# Patient Record
Sex: Female | Born: 2000 | Race: White | Hispanic: No | Marital: Single | Smoking: Never smoker
Health system: Southern US, Community
[De-identification: ages and names within clinical notes are randomized; demographics above are authoritative.]

## PROBLEM LIST (undated history)

## (undated) DIAGNOSIS — F319 Bipolar disorder, unspecified: Secondary | ICD-10-CM

---
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
## 2020-02-12 ENCOUNTER — Encounter: Payer: Self-pay | Admitting: Psychiatry

## 2023-11-30 ENCOUNTER — Ambulatory Visit
Admission: EM | Admit: 2023-11-30 | Discharge: 2023-11-30 | Disposition: A | Discharge status: Home / Self Care | Attending: Family Medicine | Admitting: Family Medicine

## 2023-11-30 DIAGNOSIS — J069 Acute upper respiratory infection, unspecified: Secondary | ICD-10-CM

## 2023-11-30 LAB — POC COVID19/FLU A&B COMBO
Covid Antigen, POC: NEGATIVE
Influenza A Antigen, POC: NEGATIVE
Influenza B Antigen, POC: NEGATIVE

## 2023-11-30 MED ORDER — CETIRIZINE HCL 10 MG PO TABS: 10.0000 mg | ORAL_TABLET | Freq: Every day | ORAL | 0 refills | Status: AC

## 2023-11-30 MED ORDER — PROMETHAZINE-DM 6.25-15 MG/5ML PO SYRP: 5.0000 mL | ORAL_SOLUTION | Freq: Three times a day (TID) | ORAL | 0 refills | Status: AC | PRN

## 2023-11-30 MED ORDER — IBUPROFEN 600 MG PO TABS: 600.0000 mg | ORAL_TABLET | Freq: Four times a day (QID) | ORAL | 0 refills | Status: AC | PRN

## 2023-11-30 MED ORDER — PSEUDOEPHEDRINE HCL 30 MG PO TABS: 30.0000 mg | ORAL_TABLET | Freq: Three times a day (TID) | ORAL | 0 refills | Status: AC | PRN

## 2023-11-30 NOTE — ED Provider Notes (Signed)
 [facility] - URGENT CARE C[facility]  Note:  This document was prepared using Conservation officer, historic buildings and may include unintentional dictation errors.  MRN: [id number] DOB: 2001-03-31  Subjective:   Melinda Lutz is a 23 y.o. female presenting for 1 day history of chills, sweats, then started to have fever, fatigue. Used ibuprofen  with some relief of her fevers, bad headache.  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  No known established diagnoses.  No chest pain, shortness of breath, wheezing, nausea, vomiting, abdominal pain, body pains, ear pain, rashes.  No history of asthma.  No smoking of any kind including cigarettes, cigars, vaping, marijuana use.    No current facility-administered medications for this encounter.  Current Outpatient Medications:    hydrOXYzine (ATARAX) 25 MG tablet, Take 25 mg by mouth 3 (three) times daily., Disp: , Rfl:    ibuprofen  (ADVIL ) 200 MG tablet, Take 200 mg by mouth every 6 (six) hours as needed., Disp: , Rfl:    Norgestim-Eth Estrad Triphasic (NORGESTIMATE-ETHINYL ESTRADIOL TRIPHASIC) 0.18/0.215/0.25 MG-25 MCG tab, Take 1 tablet by mouth daily., Disp: , Rfl:    ascorbic acid (VITAMIN C) 100 MG tablet, Take 100 mg by mouth., Disp: , Rfl:    No Known Allergies  Past Medical History:  Diagnosis Date   Bipolar 1 disorder (HCC)      Past Surgical History:  Procedure Laterality Date   BREAST CYST EXCISION      History reviewed. No pertinent family history.  Social History   Tobacco Use   Smoking status: Never   Smokeless tobacco: Never  Vaping Use   Vaping status: Never Used  Substance Use Topics   Alcohol use: No   Drug use: Never    ROS   Objective:   Vitals: BP 126/77 (BP Location: Left Arm)   Pulse 80   Temp (!) 97.4 F (36.3 C) (Oral) Comment: needs recheck- thermomether not working correctly  Resp 16   SpO2 96%   Physical Exam Constitutional:      General: She is not in  acute distress.    Appearance: Normal appearance. She is well-developed and normal weight. She is not ill-appearing, toxic-appearing or diaphoretic.  HENT:     Head: Normocephalic and atraumatic.     Right Ear: Tympanic membrane, ear canal and external ear normal. No drainage or tenderness. No middle ear effusion. There is no impacted cerumen. Tympanic membrane is not erythematous or bulging.     Left Ear: Tympanic membrane, ear canal and external ear normal. No drainage or tenderness.  No middle ear effusion. There is no impacted cerumen. Tympanic membrane is not erythematous or bulging.     Nose: Nose normal. No congestion or rhinorrhea.     Mouth/Throat:     Mouth: Mucous membranes are moist. No oral lesions.     Pharynx: No pharyngeal swelling, oropharyngeal exudate, posterior oropharyngeal erythema or uvula swelling.     Tonsils: No tonsillar exudate or tonsillar abscesses.  Eyes:     General: No scleral icterus.       Right eye: No discharge.        Left eye: No discharge.     Extraocular Movements: Extraocular movements intact.     Right eye: Normal extraocular motion.     Left eye: Normal extraocular motion.     Conjunctiva/sclera: Conjunctivae normal.  Cardiovascular:     Rate and Rhythm: Normal rate and regular rhythm.     Heart sounds:  Normal heart sounds. No murmur heard.    No friction rub. No gallop.  Pulmonary:     Effort: Pulmonary effort is normal. No respiratory distress.     Breath sounds: No stridor. No wheezing, rhonchi or rales.  Chest:     Chest wall: No tenderness.  Musculoskeletal:     Cervical back: Normal range of motion and neck supple.  Lymphadenopathy:     Cervical: No cervical adenopathy.  Skin:    General: Skin is warm and dry.  Neurological:     General: No focal deficit present.     Mental Status: She is alert and oriented to person, place, and time.  Psychiatric:        Mood and Affect: Mood normal.        Behavior: Behavior normal.      Results for orders placed or performed during the hospital encounter of 11/30/23 (from the past 24 hours)  POC Covid + Flu A/B Antigen     Status: None   Collection Time: 11/30/23  2:56 PM  Result Value Ref Range   Influenza A Antigen, POC Negative Negative   Influenza B Antigen, POC Negative Negative   Covid Antigen, POC Negative Negative    Assessment and Plan :   PDMP not reviewed this encounter.  1. Viral upper respiratory infection    Deferred imaging given clear cardiopulmonary exam, hemodynamically stable vital signs.  Suspect viral URI, viral syndrome. Physical exam findings reassuring and vital signs stable for discharge. Advised supportive care, offered symptomatic relief. Counseled patient on potential for adverse effects with medications prescribed/recommended today, ER and return-to-clinic precautions discussed, patient verbalized understanding.     Christopher Savannah, [place] 11/30/23 [phone]

## 2023-11-30 NOTE — ED Triage Notes (Signed)
 Pt reports chills since last night; fatigue, fever 100.3 F this morning. 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 Ibuprofen  gives some relief.

## 2023-11-30 NOTE — Discharge Instructions (Signed)
 We will manage this as a viral upper illness. For sore throat or cough try using a honey-based tea. Use 3 teaspoons of honey with juice squeezed from half lemon. Place shaved pieces of ginger into 1/2-1 cup of water and warm over stove top. Then mix the ingredients and repeat every 4 hours as needed. Please take ibuprofen  600mg  every 6 hours with food alternating with OR taken together with Tylenol  500mg -650mg  every 6 hours for throat pain, fevers, aches and pains. Hydrate very well with at least 2 liters of water. Eat light meals such as soups (chicken and noodles, vegetable, chicken and wild rice).  Do not eat foods that you are allergic to.  Taking an antihistamine like Zyrtec  (10mg  daily) can help against postnasal drainage, sinus congestion which can cause sinus pain, sinus headaches, throat pain, painful swallowing, coughing.  You can take this together with pseudoephedrine  (Sudafed) at a dose of 30mg  3 times a day or twice daily as needed for the same kind of nasal drip, congestion.  Use cough syrup as needed.

## 2024-02-20 ENCOUNTER — Ambulatory Visit (INDEPENDENT_AMBULATORY_CARE_PROVIDER_SITE_OTHER): Admitting: Psychiatry

## 2024-02-26 NOTE — Progress Notes (Signed)
 Assessment/Impression   1. Acute bacterial pharyngitis  amoxicillin (AMOXIL) 500 mg capsule      Patient has verbalized an understanding of the limitations for virtual video consults   Assessment & Plan Initial Assessment: Sore throat for three days. History of recurrent strep infections. Strep swab negative.  Differential Diagnosis: - Strep throat: History of recurrent strep infections. Negative strep swab. Empirical treatment with amoxicillin.  Urgent Care Course: - Prescription for amoxicillin sent to pharmacy.  Final Assessment: Empirical treatment with amoxicillin due to history of recurrent strep infections and current symptoms. Advised to replace toothbrush to prevent reinfection.  Clinical Impression: - Pharyngitis, bacterial   Disposition: - Follow-Up: Work note provided for return on Wednesday.  Patient Education: Replace toothbrush to prevent reinfection.  Shared medical decision making with patient: Patient presenting with sore throat. Patient meets [facility] Criteria for strep pharyngitis. Patient is nontoxic, well-appearing, and hydrated. Because the patient has no uvula deviation, trismus, dysphonia, or drooling there is no evidence of secondary complications such as peritonsillar abscess, retropharyngeal abscess, or epiglottitis. Patient is tolerating PO fluids without difficulty, therefore I do not suspect clinically significant dehydration. Patient has been given signs and symptoms related to these conditions which would necessitate immediate evaluation either with us  or at the emergency department, specifically for difficulty swallowing or breathing, difficulty opening their mouth, voice change, drooling, or any concern for worsening condition. Recommend symptomatic treatment with salt water gargles, ibuprofen  and tylenol  as directed for pain/fever. Follow-up with us  in the next 48 to 72 hours if no improvement or any worsening, follow-up PCP in the next 2 to 4 days even  if improved, they have verbalized understanding and are agreeable with this plan.  Patient presents today via telemedicine evaluation utilizing live HIPAA-compliant two-way audio/video software. To the best of my knowledge the patient was located within the state of [place]  at the time of their evaluation today.  Patient understands that my exam is limited via telemedicine and that additional differential diagnoses could be evident with an in person evaluation; however, patient declines in person evaluation at this time and would prefer to continue on with plan of care for most likely diagnosis based on clinical suspicion as it pertains to history and limited physical exam.  At this time, additional diagnoses are not clinically evident via telemedicine visit and do not need to be investigated further. Evidence of any other testing necessity or differential diagnoses are not evident, and does not deem further investigation at this time. Discussed risks versus benefits with thought process for further testing. Patient is agreeable with plan, and verbalizes understanding of my thought process. Patient also verbalizes understanding of strict return precautions and ED precautions regarding specific possible alternative diagnoses that are not evident at this time. Patient extensively educated and is able to verbalize education back to me prior to discharge. All questions answered, and patient denies any further questions or concerns about plan of care, discharge home, and return precautions.  Patient's previous records were reviewed, if there were any. This document was created using dragon dictation and may contain unintentional errors. If there are any questions regarding this document, please contact me for clarification.    Plan   1. Orders and medications during the visit: No orders of the defined types were placed in this encounter.  Melinda Lutz had no medications administered during this  visit.  2.  There are no Patient Instructions on file for this visit.  3.  Discharge medications and medication changes: Patient's  Medications  New Prescriptions   AMOXICILLIN (AMOXIL) 500 MG CAPSULE    Take one capsule (500 mg dose) by mouth 2 (two) times daily for 7 days.  Discontinued Medications   No medications on file    Subjective   Chief Complaint  Patient presents with   Sore Throat    x3 days, patient already went in clinic for swab, results are Negative.    HPI:  Melinda Lutz is a 23 y.o. (DOB 2001/02/04) female.  This patient was seen via video virtual visit.  History is obtained from a patient/provider video consult. The patient understands the limitations of a video visit versus a face to face encounter and has elected to proceed with virtual care visit. Further, the patient understands that in some instances the virtual visit might require further evaluation/workup with face to face care in a medical facility.   History of Present Illness The patient presents via virtual visit for evaluation of a sore throat.  She has been experiencing symptoms of a sore throat for the past 3 days. She has a documented history of streptococcal infections, typically occurring annually, often contracted from close contact with infected individuals. She reports no known recent exposure to individuals with strep but acknowledges being in a social setting on Friday night. 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   ROS:   Review of Systems is complete and negative except as noted in History of Present Illness.   Past Medical History, Past Surgery History, Allergies, Social History, and Family History, meds, and Allergies were reviewed and updated.    Objective   Vital signs: Ht 5' 2 (1.575 m)   Wt 130 lb (59 kg)   BMI 23.78 kg/m   General: Alert, no  acute distress noted during video consult. Healthy appearing, well-nourished. Psychiatric: Mental status: normal mood/affect. Active, alert.  Head: Appearance atraumatic, normocephalic. Eyes: Sclerae: non-icteric. Conjunctiva normal, right and left. HENT:  No external nose lesions. Airway patent. Uvula midline. Pharyngeal erythema present. No trismus. No evidence of PTA. Speaks full sentences. No drooling.   Mucous membranes appear moist within limitations of telemedicine exam. Neck: Moves neck freely during exam without eliciting pain.  Resp: No visible signs of respiratory distress. No labored breathing noted. Normal appearing respiratory rate. Speaks full sentences. No audible wheezing. Musculoskeletal: Range of motion appears grossly normal (noted on limited extremity visualization through virtual video)  Neuro: Alert, grossly oriented. No obvious abnormalities noted.  Skin: No rashes. Appropriate, uniform color. No cyanosis. No jaundice or visible lesions.  Results of tests available at time of visit: Recent Results (from the past 24 hours)  POCT Strep Nucleic Acid   Collection Time: 02/26/24 12:07 PM  Result Value Ref Range   Strep Negative Negative   No results found for this or any previous visit.

## 2024-04-03 ENCOUNTER — Ambulatory Visit: Admitting: Psychiatry

## 2024-05-24 ENCOUNTER — Ambulatory Visit (INDEPENDENT_AMBULATORY_CARE_PROVIDER_SITE_OTHER): Admitting: Psychiatry

## 2024-05-24 ENCOUNTER — Encounter: Payer: Self-pay | Admitting: Psychiatry

## 2024-05-24 VITALS — BP 134/72 | HR 80 | Ht 64.0 in | Wt 131.0 lb

## 2024-05-24 DIAGNOSIS — F439 Reaction to severe stress, unspecified: Secondary | ICD-10-CM | POA: Diagnosis not present

## 2024-05-24 DIAGNOSIS — F902 Attention-deficit hyperactivity disorder, combined type: Secondary | ICD-10-CM | POA: Diagnosis not present

## 2024-05-24 DIAGNOSIS — F411 Generalized anxiety disorder: Secondary | ICD-10-CM

## 2024-05-24 MED ORDER — SERTRALINE HCL 100 MG PO TABS: ORAL_TABLET | ORAL | 1 refills | Status: AC

## 2024-06-21 ENCOUNTER — Ambulatory Visit: Admitting: Psychiatry
